# Patient Record
Sex: Male | Born: 1970 | Race: Black or African American | Hispanic: No | Marital: Married | Smoking: Never smoker
Health system: Southern US, Community
[De-identification: ages and names within clinical notes are randomized; demographics above are authoritative.]

---

## 2015-04-12 ENCOUNTER — Emergency Department (INDEPENDENT_AMBULATORY_CARE_PROVIDER_SITE_OTHER)
Admission: EM | Admit: 2015-04-12 | Discharge: 2015-04-12 | Disposition: A | Discharge status: Home / Self Care | Payer: Self-pay | Source: Home / Self Care | Attending: Emergency Medicine | Admitting: Emergency Medicine

## 2015-04-12 ENCOUNTER — Encounter: Payer: Self-pay | Admitting: Emergency Medicine

## 2015-04-12 ENCOUNTER — Emergency Department (INDEPENDENT_AMBULATORY_CARE_PROVIDER_SITE_OTHER): Payer: Self-pay

## 2015-04-12 DIAGNOSIS — T148 Other injury of unspecified body region: Secondary | ICD-10-CM

## 2015-04-12 DIAGNOSIS — M79671 Pain in right foot: Secondary | ICD-10-CM

## 2015-04-12 DIAGNOSIS — T148XXA Other injury of unspecified body region, initial encounter: Secondary | ICD-10-CM

## 2015-04-12 DIAGNOSIS — Z23 Encounter for immunization: Secondary | ICD-10-CM

## 2015-04-12 MED ORDER — CIPROFLOXACIN HCL 500 MG PO TABS: 500.0000 mg | ORAL_TABLET | Freq: Two times a day (BID) | ORAL | Status: AC

## 2015-04-12 MED ORDER — TETANUS-DIPHTH-ACELL PERTUSSIS 5-2.5-18.5 LF-MCG/0.5 IM SUSP
0.5000 mL | Freq: Once | INTRAMUSCULAR | Status: AC
  Administered 2015-04-12: 0.5 mL via INTRAMUSCULAR

## 2015-04-12 NOTE — ED Notes (Signed)
Stepped on a nail right foot today at work, John Mclaughlin

## 2015-04-12 NOTE — ED Provider Notes (Signed)
CSN: [id number]     Arrival date & time 04/12/15  1813 History   First MD Initiated Contact with Patient 04/12/15 1820     Chief Complaint  Patient presents with  . 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  The nail did not get stuck.  The nail was exposed from a broken palate.  Small amount of bleeding and then stop.  The nail did go through the bottom of his tennis shoe.  He has pain on the bottom of his foot.  No previous injuries.  No fever, chills.  He has about 5 years status post tetanus shot.  History reviewed. No pertinent past medical history. History reviewed. No pertinent past surgical history. No family history on file. History  Substance Use Topics  . Smoking status: Never Smoker   . Smokeless tobacco: Not on file  . Alcohol Use: Yes    Review of Systems  All other systems reviewed and are negative.   Allergies  Review of patient's allergies indicates no known allergies.  Home Medications   Prior to Admission medications   Medication Sig Start Date End Date Taking? Authorizing Provider  ciprofloxacin (CIPRO) 500 MG tablet Take 1 tablet (500 mg total) by mouth 2 (two) times daily. 04/12/15   Marlaine Hind, MD   BP 128/89 mmHg  Pulse 71  Temp(Src) 97.7 F (36.5 C) (Oral)  Ht 5\' 8"  (1.727 m)  Wt 187 lb (84.823 kg)  BMI 28.44 kg/m2  SpO2 99% Physical Exam  Constitutional: He is oriented to person, place, and time. He appears well-developed and well-nourished.  Non-toxic appearance. He does not appear ill.  HENT:  Head: Normocephalic and atraumatic.  Eyes: No scleral icterus.  Neck: Neck supple.  Cardiovascular: Regular rhythm and normal heart sounds.   Pulmonary/Chest: Effort normal and breath sounds normal. No respiratory distress.  Neurological: He is alert and oriented to person, place,  and time.  Skin: Skin is warm and dry.  On the plantar lateral aspect of his right midfoot there is a small puncture wound.  No signs of infection.  No foreign body seen.  No signs of infection or synovitis.  No drainage.  It has mild tenderness to palpation.  Psychiatric: He has a normal mood and affect. His speech is normal.  Nursing note and vitals reviewed.   ED Course  Procedures (including critical care time) Labs Review Labs Reviewed - No data to display  Imaging Review Dg Foot Complete Right  04/12/2015   CLINICAL DATA:  Patient stepped on nail with pain in the lateral aspect of the right foot near the head of the fifth metatarsal.  EXAM: RIGHT FOOT COMPLETE - 3+ VIEW  COMPARISON:  None.  FINDINGS: Normal anatomic alignment. No evidence for acute fracture or dislocation. Regional soft tissues are unremarkable. No radiopaque foreign body.  IMPRESSION: No acute osseous abnormality.   Electronically Signed   By: Annia Belt M.D.   On: 04/12/2015 19:01     MDM   1. Puncture wound   2. Right foot pain    X-ray obtained and read by radiology as above. Prescription for Cipro given to cover the possibility of Pseudomonas as the nail penetrated bottom of shoe. Wound is irrigated and it is soaked in Betadine.  No signs of retained foreign body seen on Xray. Tetanus shot given. Patient follow-up next week to  confirm that he is getting better and no signs of abscess.  The possibility of osteomyelitis is present with this type of wound, but I do not believe there is any bony involvement at his metatarsals or his metatarsal heads.  He should be followed to confirm he is getting better.  Marlaine Hind, MD 04/12/15 [phone]

## 2015-12-21 ENCOUNTER — Emergency Department (INDEPENDENT_AMBULATORY_CARE_PROVIDER_SITE_OTHER): Payer: 59

## 2015-12-21 ENCOUNTER — Encounter: Payer: Self-pay | Admitting: Emergency Medicine

## 2015-12-21 ENCOUNTER — Emergency Department (INDEPENDENT_AMBULATORY_CARE_PROVIDER_SITE_OTHER)
Admission: EM | Admit: 2015-12-21 | Discharge: 2015-12-21 | Disposition: A | Discharge status: Home / Self Care | Payer: 59 | Source: Home / Self Care | Attending: Family Medicine | Admitting: Family Medicine

## 2015-12-21 DIAGNOSIS — R0781 Pleurodynia: Secondary | ICD-10-CM

## 2015-12-21 MED ORDER — MELOXICAM 15 MG PO TABS: 15.0000 mg | ORAL_TABLET | Freq: Every day | ORAL | Status: AC

## 2015-12-21 MED ORDER — HYDROCODONE-ACETAMINOPHEN 5-325 MG PO TABS: ORAL_TABLET | ORAL | Status: AC

## 2015-12-21 NOTE — ED Provider Notes (Signed)
CSN: [id number]     Arrival date & time 12/21/15  1518 History   First MD Initiated Contact with Patient 12/21/15 1607     Chief Complaint  Patient presents with  . Chest Pain      HPI Comments: Patient complains of 3 day history of pain in his left anterior ribs.  He recalls no injury.  No recent URI.  He notes that Aleve helps somewhat.  Patient is a 45 y.o. male presenting with chest pain. The history is provided by the patient.  Chest Pain Pain location:  L chest Pain quality: aching   Pain radiates to:  Does not radiate Pain radiates to the back: no   Pain severity:  Mild Onset quality:  Sudden Duration:  3 days Timing:  Constant Progression:  Unchanged Chronicity:  New Context: breathing and movement   Relieved by: Aleve. Worsened by:  Coughing, movement, deep breathing and certain positions Associated symptoms: no abdominal pain, no cough, no diaphoresis, no dysphagia, no fatigue, no fever, no nausea, no palpitations and no shortness of breath     History reviewed. No pertinent past medical history. History reviewed. No pertinent past surgical history. No family history on file. Social History  Substance Use Topics  . Smoking status: Never Smoker   . Smokeless tobacco: None  . Alcohol Use: Yes    Review of Systems  Constitutional: Negative for fever, diaphoresis and fatigue.  HENT: Negative for trouble swallowing.   Respiratory: Negative for cough and shortness of breath.   Cardiovascular: Positive for chest pain. Negative for palpitations.  Gastrointestinal: Negative for nausea and abdominal pain.  All other systems reviewed and are negative.   Allergies  Review of patient's allergies indicates no known allergies.  Home Medications   Prior to Admission medications   Medication Sig Start Date End Date Taking? Authorizing Provider  ciprofloxacin (CIPRO) 500 MG tablet Take 1 tablet (500 mg total) by mouth 2 (two) times daily. 04/12/15   Marlaine HindJeffrey H Henderson, MD    HYDROcodone-acetaminophen (NORCO/VICODIN) 5-325 MG tablet Take one by mouth at bedtime as needed for pain 12/21/15   Lattie HawStephen A Oluwadamilola Deliz, MD  meloxicam (MOBIC) 15 MG tablet Take 1 tablet (15 mg total) by mouth daily. Take with food. 12/21/15   Lattie HawStephen A Terri Malerba, MD   Meds Ordered and Admi                       nistered this Visit  Medications - No data to display  BP 139/99 mmHg  Pulse 76  Temp(Src) 98.3 F (36.8 C) (Oral)  Ht 5\' 7"  (1.702 m)  Wt 200 lb (90.719 kg)  BMI 31.32 kg/m2  SpO2 98% No data found.   Physical Exam  Constitutional: He is oriented to person, place, and time. He appears well-developed and well-nourished. No distress.  HENT:  Head: Normocephalic.  Nose: Nose normal.  Mouth/Throat: Oropharynx is clear and moist.  Eyes: Conjunctivae are normal. Pupils are equal, round, and reactive to light.  Neck: Neck supple.  Cardiovascular: Normal heart sounds.   Pulmonary/Chest: Breath sounds normal. He has no wheezes. He has no rales. He exhibits tenderness.    There is distinct tenderness to palpation over left anterior/inferior ribs as noted on diagram.    Abdominal: There is no tenderness.  Musculoskeletal: He exhibits no edema or tenderness.  Lymphadenopathy:    He has no cervical adenopathy.  Neurological: He is alert and oriented to person, place, and time.  Skin:  Skin is warm and dry. No rash noted.  Nursing note and vitals reviewed.   ED Course  Procedures none   Imaging Review Dg Ribs Unilateral W/chest Left  12/21/2015  CLINICAL DATA:  Left-sided chest pain for 3 days, no known injury initial encounter EXAM: LEFT RIBS AND CHEST - 3+ VIEW COMPARISON:  None. FINDINGS: Cardiac shadow is within normal limits. The lungs are clear bilaterally. No acute rib fracture is noted. No pneumothorax is seen. IMPRESSION: No acute abnormality noted. Electronically Signed   By: Alcide Clever M.D.   On: 12/21/2015 16:13     MDM   1. Rib pain on left side; ?costochondritis     Begin Mobic  daily.  Lortab at bedtime prn. Dispensed rib belt. Wear rib belt when at work.  Apply ice pack for 20 to 30 minutes, 3 to 4 times daily  Continue until pain decreases.  May take Tylenol once or twice daily as needed for pain. Followup with Dr. Rodney Langton or Dr. Clementeen [facility] ([facility]) if not improving about two weeks.     Lattie Haw, MD 12/24/15 [phone]

## 2015-12-21 NOTE — ED Notes (Signed)
Left rib pain x 3 days, denies injury

## 2015-12-21 NOTE — Discharge Instructions (Signed)
Wear rib belt when at work.  Apply ice pack for 20 to 30 minutes, 3 to 4 times daily  Continue until pain decreases.  May take Tylenol once or twice daily as needed for pain.   Chest Wall Pain Chest wall pain is pain in or around the bones and muscles of your chest. Sometimes, an injury causes this pain. Sometimes, the cause may not be known. This pain may take several weeks or longer to get better. HOME CARE INSTRUCTIONS  Pay attention to any changes in your symptoms. Take these actions to help with your pain:   Rest as told by your health care provider.   Avoid activities that cause pain. These include any activities that use your chest muscles or your abdominal and side muscles to lift heavy items.   If directed, apply ice to the painful area:  Put ice in a plastic bag.  Place a towel between your skin and the bag.  Leave the ice on for 20 minutes, 2-3 times per day.  Take over-the-counter and prescription medicines only as told by your health care provider.  Do not use tobacco products, including cigarettes, chewing tobacco, and e-cigarettes. If you need help quitting, ask your health care provider.  Keep all follow-up visits as told by your health care provider. This is important. SEEK MEDICAL CARE IF:  You have a fever.  Your chest pain becomes worse.  You have new symptoms. SEEK IMMEDIATE MEDICAL CARE IF:  You have nausea or vomiting.  You feel sweaty or light-headed.  You have a cough with phlegm (sputum) or you cough up blood.  You develop shortness of breath.   This information is not intended to replace advice given to you by your health care provider. Make sure you discuss any questions you have with your health care provider.   Document Released: 10/06/2005 Document Revised: 06/27/2015 Document Reviewed: 01/01/2015 Elsevier Interactive Patient Education [employer]2016 [employer] Inc[employer]

## 2017-03-26 ENCOUNTER — Emergency Department (HOSPITAL_BASED_OUTPATIENT_CLINIC_OR_DEPARTMENT_OTHER): Payer: 59

## 2017-03-26 ENCOUNTER — Encounter (HOSPITAL_BASED_OUTPATIENT_CLINIC_OR_DEPARTMENT_OTHER): Payer: Self-pay

## 2017-03-26 DIAGNOSIS — R05 Cough: Secondary | ICD-10-CM | POA: Diagnosis present

## 2017-03-26 DIAGNOSIS — J209 Acute bronchitis, unspecified: Secondary | ICD-10-CM | POA: Diagnosis not present

## 2017-03-26 NOTE — ED Triage Notes (Signed)
Pt c/o productive cough since yesterday with a nosebleed yesterday, and today he is seeing blood in his sputum when he coughs, no fevers, no night sweats, no unexplained weightloss

## 2017-03-27 ENCOUNTER — Emergency Department (HOSPITAL_BASED_OUTPATIENT_CLINIC_OR_DEPARTMENT_OTHER)
Admission: EM | Admit: 2017-03-27 | Discharge: 2017-03-27 | Disposition: A | Discharge status: Home / Self Care | Payer: 59 | Attending: Emergency Medicine | Admitting: Emergency Medicine

## 2017-03-27 DIAGNOSIS — J40 Bronchitis, not specified as acute or chronic: Secondary | ICD-10-CM

## 2017-03-27 MED ORDER — ALBUTEROL SULFATE HFA 108 (90 BASE) MCG/ACT IN AERS
2.0000 | INHALATION_SPRAY | Freq: Once | RESPIRATORY_TRACT | Status: AC
  Administered 2017-03-27: 2 via RESPIRATORY_TRACT
  Filled 2017-03-27: qty 6.7

## 2017-03-27 MED ORDER — BENZONATATE 100 MG PO CAPS: 100.0000 mg | ORAL_CAPSULE | Freq: Three times a day (TID) | ORAL | 0 refills | Status: AC

## 2017-03-27 NOTE — ED Provider Notes (Signed)
[facility]   CSN: [id number] Arrival date & time: 03/26/17  2319     History   Chief Complaint Chief Complaint  Patient presents with  . Cough    HPI John Mclaughlin is a 46 y.o. male.  HPI  Pt presenting with c/o cough.  He states that he has had a cough for the past several days, today he began seeing some blood mixed in the mucous when he coughs.  He denies fever/chills. No difficulty breathing.  No night sweats.   He also had a nosebleed today which was relieved with direct pressure.  No chest pain.  No abdomial pain or vomiting.  He denies smoking.  No easy brusing or bleeding.  He does not have a hx of HTN.  There are no other associated systemic symptoms, there are no other alleviating or modifying factors.   History reviewed. No pertinent past medical history.  There are no active problems to display for this patient.   History reviewed. No pertinent surgical history.     Home Medications    Prior to Admission medications   Medication Sig Start Date End Date Taking? Authorizing Provider  benzonatate (TESSALON) 100 MG capsule Take 1 capsule (100 mg total) by mouth every 8 (eight) hours. 03/27/17   Jerelyn ScottLinker, Martha, MD  ciprofloxacin (CIPRO) 500 MG tablet Take 1 tablet (500 mg total) by mouth 2 (two) times daily. 04/12/15   Marlaine HindHenderson, Jeffrey H, MD  HYDROcodone-acetaminophen (NORCO/VICODIN) 5-325 MG tablet Take one by mouth at bedtime as needed for pain 12/21/15   Lattie HawBeese, Stephen A, MD  meloxicam (MOBIC) 15 MG tablet Take 1 tablet (15 mg total) by mouth daily. Take with food. 12/21/15   Lattie HawBeese, Stephen A, MD    Family History No family history on file.  Social History Social History  Substance Use Topics  . Smoking status: Never Smoker  . Smokeless tobacco: Not on file  . Alcohol use Yes     Allergies   Patient has no known allergies.   Review of Systems Review of Systems  ROS reviewed and all otherwise negative except for mentioned in  HPI   Physical Exam Updated Vital Signs BP (!) 135/99 (BP Location: Left Arm)   Pulse 74   Temp 98 F (36.7 C) (Oral)   Resp 18   Ht 5\' 7"  (1.702 m)   Wt 89.8 kg (198 lb)   SpO2 100%   BMI 31.01 kg/m  Vitals reviewed Physical Exam Physical Examination: General appearance - alert, well appearing, and in no distress Mental status - alert, oriented to person, place, and time Eyes - no conjunctival injection, no scleral icterus Nose - normal and patent, no erythema, discharge or polyps, no area of bleeding or dried blood  Mouth - mucous membranes moist, pharynx normal without lesions Chest - clear to auscultation, no wheezes, rales or rhonchi, symmetric air entry Heart - normal rate, regular rhythm, normal S1, S2, no murmurs, rubs, clicks or gallops Neurological - alert, oriented, normal speech, no focal findings or movement disorder noted Extremities - peripheral pulses normal, no pedal edema, no clubbing or cyanosis Skin - normal coloration and turgor, no rashes  ED Treatments / Results  Labs (all labs ordered are listed, but only abnormal results are displayed) Labs Reviewed - No data to display  EKG  EKG Interpretation None       Radiology Dg Chest 2 View  Result Date: 03/27/2017 CLINICAL DATA:  Productive cough for 2 days. EXAM:  CHEST  2 VIEW COMPARISON:  12/21/2015 FINDINGS: The cardiomediastinal contours are normal. The lungs are clear. Pulmonary vasculature is normal. No consolidation, pleural effusion, or pneumothorax. No acute osseous abnormalities are seen. IMPRESSION: No acute abnormality. Electronically Signed   By: Rubye Oaks M.D.   On: 03/27/2017 00:58    Procedures Procedures (including critical care time)  Medications Ordered in ED Medications  albuterol (PROVENTIL HFA;VENTOLIN HFA) 108 (90 Base) MCG/ACT inhaler 2 puff (2 puffs Inhalation Given 03/27/17 0140)     Initial Impression / Assessment and Plan / ED Course  I have reviewed the triage  vital signs and the nursing notes.  Pertinent labs & imaging results that were available during my care of the patient were reviewed by me and considered in my medical decision making (see chart for details).     Pt presenting with cough and blood in sputum.  CXR is reassuring.  Pt has normal respiratory effort.  No signs of pneumonia, no large lung cancer, no risk factors for TB.  Most likely cause is bronchitis.  Pt given albuterol inhaler and rx for tessalon perles to help with cough.  Pt is nontoxic appearing.  Discharged with strict return precautions.  Pt agreeable with plan.  Final Clinical Impressions(s) / ED Diagnoses   Final diagnoses:  Bronchitis    New Prescriptions Discharge Medication List as of 03/27/2017  1:18 AM    START taking these medications   Details  benzonatate (TESSALON) 100 MG capsule Take 1 capsule (100 mg total) by mouth every 8 (eight) hours., Starting Fri 03/27/2017, Print         Jerelyn Scott, MD 03/27/17 [phone]

## 2017-03-27 NOTE — Discharge Instructions (Signed)
Return to the ED with any concerns including difficulty breathing despite using albuterol every 4 hours, not drinking fluids, decreased urine output, vomiting and not able to keep down liquids or medications, decreased level of alertness/lethargy, or any other alarming symptoms °

## 2019-01-16 ENCOUNTER — Encounter (HOSPITAL_COMMUNITY): Payer: Self-pay

## 2019-01-16 ENCOUNTER — Other Ambulatory Visit: Payer: Self-pay

## 2019-01-16 ENCOUNTER — Emergency Department (HOSPITAL_COMMUNITY): Payer: No Typology Code available for payment source

## 2019-01-16 ENCOUNTER — Emergency Department (HOSPITAL_COMMUNITY)
Admission: EM | Admit: 2019-01-16 | Discharge: 2019-01-16 | Disposition: A | Discharge status: Home / Self Care | Payer: No Typology Code available for payment source | Attending: Emergency Medicine | Admitting: Emergency Medicine

## 2019-01-16 DIAGNOSIS — M25512 Pain in left shoulder: Secondary | ICD-10-CM | POA: Diagnosis present

## 2019-01-16 DIAGNOSIS — Z79899 Other long term (current) drug therapy: Secondary | ICD-10-CM | POA: Insufficient documentation

## 2019-01-16 DIAGNOSIS — R0789 Other chest pain: Secondary | ICD-10-CM | POA: Diagnosis not present

## 2019-01-16 DIAGNOSIS — Y999 Unspecified external cause status: Secondary | ICD-10-CM | POA: Diagnosis not present

## 2019-01-16 DIAGNOSIS — Y939 Activity, unspecified: Secondary | ICD-10-CM | POA: Insufficient documentation

## 2019-01-16 DIAGNOSIS — M79642 Pain in left hand: Secondary | ICD-10-CM | POA: Insufficient documentation

## 2019-01-16 DIAGNOSIS — Y929 Unspecified place or not applicable: Secondary | ICD-10-CM | POA: Diagnosis not present

## 2019-01-16 MED ORDER — HYDROCODONE-ACETAMINOPHEN 5-325 MG PO TABS: 1.0000 | ORAL_TABLET | ORAL | 0 refills | Status: AC | PRN

## 2019-01-16 MED ORDER — METHOCARBAMOL 750 MG PO TABS: 750.0000 mg | ORAL_TABLET | Freq: Four times a day (QID) | ORAL | 0 refills | Status: AC
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
## 2021-05-11 ENCOUNTER — Other Ambulatory Visit: Payer: Self-pay

## 2021-05-11 ENCOUNTER — Emergency Department (HOSPITAL_BASED_OUTPATIENT_CLINIC_OR_DEPARTMENT_OTHER)
Admission: EM | Admit: 2021-05-11 | Discharge: 2021-05-11 | Disposition: A | Discharge status: Home / Self Care | Payer: 59 | Attending: Emergency Medicine | Admitting: Emergency Medicine

## 2021-05-11 DIAGNOSIS — J029 Acute pharyngitis, unspecified: Secondary | ICD-10-CM | POA: Diagnosis present

## 2021-05-11 DIAGNOSIS — Z20822 Contact with and (suspected) exposure to covid-19: Secondary | ICD-10-CM

## 2021-05-11 DIAGNOSIS — H938X3 Other specified disorders of ear, bilateral: Secondary | ICD-10-CM | POA: Diagnosis not present

## 2021-05-11 DIAGNOSIS — U071 COVID-19: Secondary | ICD-10-CM | POA: Insufficient documentation

## 2021-05-11 LAB — RESP PANEL BY RT-PCR (FLU A&B, COVID) ARPGX2
Influenza A by PCR: NEGATIVE
Influenza B by PCR: NEGATIVE
SARS Coronavirus 2 by RT PCR: POSITIVE — AB

## 2021-05-11 LAB — GROUP A STREP BY PCR: Group A Strep by PCR: NOT DETECTED

## 2021-05-11 NOTE — ED Provider Notes (Signed)
[facility] EMERGENCY DEPARTMENT Provider Note   CSN: [id number] Arrival date & time: 05/11/21  1833    History Chief Complaint  Patient presents with   Sore Throat    John Mclaughlin is a 50 y.o. male with past medical history who presents for evaluation of sore throat.  Patient with sore throat, congestion, rhinorrhea, headache, bilateral ear fullness x1 week.  He is not vaccinated for COVID.  Cough is nonproductive.  Described as sore throat is scratchy.  Unsure of exposed to COVID.  He rates his pain in his throat and 8/10.  There is an onset" headache.  No fever, chills, nausea, vomiting, chest pain, shortness of breath, abdominal pain, lower extremity swelling.  Denies additional aggravating or alleviating factors.  States he has history of strep and this feels similar.  History obtained from patient and past medical records.  No interpreter used.  HPI     No past medical history on file.  There are no problems to display for this patient.   No past surgical history on file.     No family history on file.  Social History   Tobacco Use   Smoking status: Never   Smokeless tobacco: Never  Substance Use Topics   Alcohol use: Yes    Home Medications Prior to Admission medications   Medication Sig Start Date End Date Taking? Authorizing Provider  benzonatate (TESSALON) 100 MG capsule Take 1 capsule (100 mg total) by mouth every 8 (eight) hours. 03/27/17   Mabe, Latanya Maudlin, MD  ciprofloxacin (CIPRO) 500 MG tablet Take 1 tablet (500 mg total) by mouth 2 (two) times daily. 04/12/15   Marlaine Hind, MD  HYDROcodone-acetaminophen (NORCO/VICODIN) 5-325 MG tablet Take one by mouth at bedtime as needed for pain 12/21/15   Lattie Haw, MD  HYDROcodone-acetaminophen (NORCO/VICODIN) 5-325 MG tablet Take 1-2 tablets by mouth every 4 (four) hours as needed. 01/16/19   Lorre Nick, MD  meloxicam (MOBIC) 15 MG tablet Take 1 tablet (15 mg total) by mouth daily. Take with  food. 12/21/15   Lattie Haw, MD  methocarbamol (ROBAXIN-750) 750 MG tablet Take 1 tablet (750 mg total) by mouth 4 (four) times daily. 01/16/19   Lorre Nick, MD    Allergies    Patient has no known allergies.  Review of Systems   Review of Systems  Constitutional: Negative.   HENT:  Positive for ear pain, postnasal drip, rhinorrhea and sore throat. Negative for congestion, dental problem, drooling, ear discharge, facial swelling, sinus pressure, sinus pain, sneezing, trouble swallowing and voice change.   Respiratory:  Positive for cough. Negative for apnea, choking, chest tightness, shortness of breath, wheezing and stridor.   Cardiovascular: Negative.   Gastrointestinal: Negative.   Genitourinary: Negative.   Musculoskeletal: Negative.   Skin: Negative.   Neurological: Negative.   All other systems reviewed and are negative.  Physical Exam Updated Vital Signs BP (!) 152/97 (BP Location: Left Arm)   Pulse 99   Temp 98.6 F (37 C) (Oral)   Resp 18   Ht 5\' 8"  (1.727 m)   Wt 94.3 kg   SpO2 100%   BMI 31.63 kg/m   Physical Exam Vitals and nursing note reviewed.  Constitutional:      General: He is not in acute distress.    Appearance: He is well-developed. He is obese. He is not ill-appearing, toxic-appearing or diaphoretic.  HENT:     Head: Normocephalic and atraumatic.     Nose: No  congestion or rhinorrhea.     Mouth/Throat:     Lips: Pink.     Mouth: Mucous membranes are moist.     Tongue: No lesions. Tongue does not deviate from midline.     Palate: No lesions.     Pharynx: Oropharynx is clear. Uvula midline.     Tonsils: No tonsillar abscesses. 2+ on the right. 2+ on the left.     Comments: Difficult exam due to patient gagging.  Tongue midline.  Sublingual area soft.  Uvula midline.  Tonsils 1+ bilaterally with some mild erythema.  No obvious exudates.  No obvious PTA or RPA Eyes:     Pupils: Pupils are equal, round, and reactive to light.  Neck:      Comments: No neck stiffness or neck rigidity.  Phonation normal Cardiovascular:     Rate and Rhythm: Normal rate and regular rhythm.     Pulses: Normal pulses.          Radial pulses are 2+ on the right side and 2+ on the left side.  Pulmonary:     Effort: Pulmonary effort is normal. No respiratory distress.     Breath sounds: Normal breath sounds and air entry.     Comments: Clear bilaterally, speaks in full sentences without difficulty Abdominal:     General: There is no distension.     Palpations: Abdomen is soft.  Musculoskeletal:        General: Normal range of motion.     Cervical back: Normal range of motion and neck supple.  Skin:    General: Skin is warm and dry.  Neurological:     General: No focal deficit present.     Mental Status: He is alert and oriented to person, place, and time.    ED Results / Procedures / Treatments   Labs (all labs ordered are listed, but only abnormal results are displayed) Labs Reviewed  GROUP A STREP BY PCR  RESP PANEL BY RT-PCR (FLU A&B, COVID) [id number]    EKG None  Radiology No results found.  Procedures Procedures   Medications Ordered in ED Medications - No data to display  ED Course  I have reviewed the triage vital signs and the nursing notes.  Pertinent labs & imaging results that were available during my care of the patient were reviewed by me and considered in my medical decision making (see chart for details).  Here for evaluation of sore throat.  He is afebrile, nonseptic, not ill-appearing.  Associated mild headache, nonproductive cough postnasal drip.  Difficult to view p.o. due to patient constantly gagging however uvula midline, tonsils 1+ bilaterally small erythema, no obvious exudate.  Sublingual area soft.  Heart lungs clear.  Abdomen soft.  No evidence of otitis.   Strep negative  COVID pending  Symptoms likely viral in nature.  Low suspicion for PTA, RPA, deep space infection.  He is tolerating secretions  here DC home symptomatic management.  The patient has been appropriately medically screened and/or stabilized in the ED. I have low suspicion for any other emergent medical condition which would require further screening, evaluation or treatment in the ED or require inpatient management.  Patient is hemodynamically stable and in no acute distress.  Patient able to ambulate in department prior to ED.  Evaluation does not show acute pathology that would require ongoing or additional emergent interventions while in the emergency department or further inpatient treatment.  I have discussed the diagnosis with the patient and answered all  questions.  Pain is been managed while in the emergency department and patient has no further complaints prior to discharge.  Patient is comfortable with plan discussed in room and is stable for discharge at this time.  I have discussed strict return precautions for returning to the emergency department.  Patient was encouraged to follow-up with PCP/specialist refer to at discharge.     MDM Rules/Calculators/A&P                           Kaemon Barnett was evaluated in Emergency Department on 05/11/2021 for the symptoms described in the history of present illness. He was evaluated in the context of the global COVID-19 pandemic, which necessitated consideration that the patient might be at risk for infection with the SARS-CoV-2 virus that causes COVID-19. Institutional protocols and algorithms that pertain to the evaluation of patients at risk for COVID-19 are in a state of rapid change based on information released by regulatory bodies including the CDC and federal and state organizations. These policies and algorithms were followed during the patient's care in the ED.  Final Clinical Impression(s) / ED Diagnoses Final diagnoses:  Sore throat  Person under investigation for COVID-19    Rx / DC Orders ED Discharge Orders     None        Kayleanna Lorman A,  PA-C 05/11/21 1951    Jacalyn Lefevre, MD 05/11/21 2148

## 2021-05-11 NOTE — Discharge Instructions (Addendum)
May take Tylenol and Ibuprofen as needed for pain  Return for new or worsening symptoms

## 2021-05-11 NOTE — ED Notes (Signed)
One week hx of sore throat, pain when swallowing, also have a cough.

## 2021-05-11 NOTE — ED Notes (Signed)
Covid Swab and Rapid Strep obtained

## 2021-05-11 NOTE — ED Triage Notes (Signed)
Pt c/o congestion, sore throat and ear pain x 1 week. States gets strep frequently
